# Patient Record
Sex: Male | Born: 1975 | Race: White | Hispanic: No | Marital: Married | Smoking: Former smoker
Health system: Southern US, Community
[De-identification: ages and names within clinical notes are randomized; demographics above are authoritative.]

## PROBLEM LIST (undated history)

## (undated) DIAGNOSIS — E291 Testicular hypofunction: Secondary | ICD-10-CM

## (undated) DIAGNOSIS — K219 Gastro-esophageal reflux disease without esophagitis: Secondary | ICD-10-CM

## (undated) DIAGNOSIS — R7689 Other specified abnormal immunological findings in serum: Secondary | ICD-10-CM

## (undated) DIAGNOSIS — S46009A Unspecified injury of muscle(s) and tendon(s) of the rotator cuff of unspecified shoulder, initial encounter: Secondary | ICD-10-CM

## (undated) DIAGNOSIS — K828 Other specified diseases of gallbladder: Secondary | ICD-10-CM

## (undated) DIAGNOSIS — R790 Abnormal level of blood mineral: Secondary | ICD-10-CM

## (undated) DIAGNOSIS — R569 Unspecified convulsions: Secondary | ICD-10-CM

## (undated) DIAGNOSIS — I10 Essential (primary) hypertension: Secondary | ICD-10-CM

## (undated) DIAGNOSIS — R768 Other specified abnormal immunological findings in serum: Secondary | ICD-10-CM

## (undated) HISTORY — DX: Other specified diseases of gallbladder: K82.8

## (undated) HISTORY — DX: Gastro-esophageal reflux disease without esophagitis: K21.9

## (undated) HISTORY — DX: Other specified abnormal immunological findings in serum: R76.89

## (undated) HISTORY — DX: Abnormal level of blood mineral: R79.0

## (undated) HISTORY — DX: Unspecified injury of muscle(s) and tendon(s) of the rotator cuff of unspecified shoulder, initial encounter: S46.009A

## (undated) HISTORY — DX: Testicular hypofunction: E29.1

## (undated) HISTORY — DX: Other specified abnormal immunological findings in serum: R76.8

## (undated) HISTORY — PX: UPPER GASTROINTESTINAL ENDOSCOPY: SHX188

## (undated) HISTORY — PX: CHOLECYSTECTOMY: SHX55

## (undated) HISTORY — DX: Essential (primary) hypertension: I10

---

## 2012-07-05 ENCOUNTER — Encounter: Payer: Self-pay | Admitting: Internal Medicine

## 2012-07-05 ENCOUNTER — Ambulatory Visit (INDEPENDENT_AMBULATORY_CARE_PROVIDER_SITE_OTHER): Payer: BC Managed Care – PPO | Admitting: Internal Medicine

## 2012-07-05 VITALS — BP 134/80 | HR 60 | Ht 74.0 in | Wt 246.0 lb

## 2012-07-05 DIAGNOSIS — K219 Gastro-esophageal reflux disease without esophagitis: Secondary | ICD-10-CM

## 2012-07-05 DIAGNOSIS — R1310 Dysphagia, unspecified: Secondary | ICD-10-CM | POA: Insufficient documentation

## 2012-07-05 MED ORDER — FLUCONAZOLE 100 MG PO TABS: 100.0000 mg | ORAL_TABLET | Freq: Every day | ORAL | Status: DC

## 2012-07-05 MED ORDER — OMEPRAZOLE-SODIUM BICARBONATE 40-1100 MG PO CAPS: 1.0000 | ORAL_CAPSULE | Freq: Every day | ORAL | Status: DC

## 2012-07-05 NOTE — Patient Instructions (Addendum)
You have been scheduled for an endoscopy with propofol. Please follow written instructions given to you at your visit today. If you use inhalers (even only as needed), please bring them with you on the day of your procedure.  Today we are giving you a handout on dysphagia to read and follow level 3 diet options.  We are giving you samples of Zegerid to take one daily at bedtime.    Thank you for choosing me and [facility].  Iva Boop, M.D., [facility]

## 2012-07-05 NOTE — Progress Notes (Signed)
Subjective:    Patient ID: Dakota Lopez, male    DOB: March 19, 1976, 37 y.o.   MRN: [id number]  HPI This nice man has had about a 6 week hx of solid food dysphagia. There is a suprasternal sticking point with some mild discomfort swallowing. Started after antibiotics and prednisone for otitis media. Is on Nexium for few weeks, ? Benefit. Has used PPI intermittent periods over the years for heartburn. Has gone a long time w/o PPI and no sxs at times. Losing weight lately as he is eating less - mainly sandwiches at this time. No thrush noted.   No Known Allergies Outpatient Prescriptions Prior to Visit  Medication Sig Dispense Refill  . esomeprazole (NEXIUM) 40 MG capsule Take 40 mg by mouth daily before breakfast.      . aspirin 81 MG tablet Take 81 mg by mouth daily.      . fluticasone (FLONASE) 50 MCG/ACT nasal spray Place 1 spray into the nose daily.      . Multiple Vitamin (MULTIVITAMIN) capsule Take 1 capsule by mouth daily.      . phentermine (ADIPEX-P) 37.5 MG tablet Take 37.5 mg by mouth daily before breakfast.      . predniSONE (DELTASONE) 5 MG tablet Take 5 mg by mouth. As directed      . vitamin C (ASCORBIC ACID) 500 MG tablet Take 500 mg by mouth daily.       No facility-administered medications prior to visit.   Past Medical History  Diagnosis Date  . Hypertension   . GERD (gastroesophageal reflux disease)   . Helicobacter pylori ab+     Prevpack Tx  . Biliary dyskinesia    Past Surgical History  Procedure Laterality Date  . Cholecystectomy    . Upper gastrointestinal endoscopy     History   Social History  . Marital Status: Married    Spouse Name: N/A    Number of Children: 2  .     Occupational History  . [employer] doctor     Social History Main Topics  . Smoking status: Never Smoker   . Smokeless tobacco: Current User  . Alcohol Use: No  . Drug Use: No    Social History Narrative   Married - 2 daughters   Long-haul truck driver   8 caffeine drinks daily     Family History  Problem Relation Age of Onset  . Heart attack Father   . Colon cancer Neg Hx   . Lung cancer Paternal Uncle     metastasized to stomach     Review of Systems Allergy/sinus problems All other ROS negative    Objective:   Physical Exam General:  Well-developed, well-nourished and in no acute distress Eyes:  anicteric. ENT:   Mouth and posterior pharynx free of lesions. No thrush Neck:   supple w/o thyromegaly or mass.  Lungs: Clear to auscultation bilaterally. Heart:  S1S2, no rubs, murmurs, gallops. Abdomen:  soft, non-tender, no hepatosplenomegaly, hernia, or mass and BS+.  Lymph:  no cervical or supraclavicular adenopathy. Extremities:   no edema Skin   no rash. Neuro:  A&O x 3.  Psych:  appropriate mood and  Affect.   Data Reviewed: GB US, HIDA 2009 EGD and normal duodenal bxs 2008    Assessment & Plan:  Dysphagia, unspecified  GERD (gastroesophageal reflux disease)  1. Empiric fluconazole for now as sounds like could be candida after antibiotics and prednisone 2. EGD/dilation The risks and benefits as well as alternatives of endoscopic  procedure(s) have been discussed and reviewed. All questions answered. The patient agrees to proceed. 3. Second PPI Zegerid 4. Dysphagia III diet 5. Reduce caffeine  ZO:XWRUE, Chrissie Noa

## 2012-07-10 ENCOUNTER — Encounter: Payer: Self-pay | Admitting: Internal Medicine

## 2012-07-10 ENCOUNTER — Ambulatory Visit (AMBULATORY_SURGERY_CENTER): Payer: BC Managed Care – PPO | Admitting: Internal Medicine

## 2012-07-10 VITALS — BP 131/87 | HR 55 | Temp 97.0°F | Resp 18 | Ht 74.0 in | Wt 246.0 lb

## 2012-07-10 DIAGNOSIS — R1310 Dysphagia, unspecified: Secondary | ICD-10-CM

## 2012-07-10 DIAGNOSIS — D130 Benign neoplasm of esophagus: Secondary | ICD-10-CM

## 2012-07-10 MED ORDER — SODIUM CHLORIDE 0.9 % IV SOLN: 500.0000 mL | INTRAVENOUS | Status: DC

## 2012-07-10 NOTE — Patient Instructions (Addendum)
There were changes in the esophagus that suggest a problem called eosinophilic esophagitis. I took biopsies to see if that is the case. I also stretched the esophagus to help your swallowing also.  You could stop the fluconazole medication at this point and I will call with the biopsy results and plans when they come in next week.  Thank you for choosing me and [facility].  Iva Boop, MD, FACG  YOU HAD AN ENDOSCOPIC PROCEDURE TODAY AT THE  ENDOSCOPY CENTER: Refer to the procedure report that was given to you for any specific questions about what was found during the examination.  If the procedure report does not answer your questions, please call your gastroenterologist to clarify.  If you requested that your care partner not be given the details of your procedure findings, then the procedure report has been included in a sealed envelope for you to review at your convenience later.  YOU SHOULD EXPECT: Some feelings of bloating in the abdomen. Passage of more gas than usual.  Walking can help get rid of the air that was put into your GI tract during the procedure and reduce the bloating. If you had a lower endoscopy (such as a colonoscopy or flexible sigmoidoscopy) you may notice spotting of blood in your stool or on the toilet paper. If you underwent a bowel prep for your procedure, then you may not have a normal bowel movement for a few days.  DIET: NOTHING TO EAT OR DRINK UNTIL 5:00 P.M. TODAY. 5:00 UNTIL 6:00 TODAY ONLY CLEAR LIQUIDS. AFTER 6 PM ONLY SOFT FOODS UNTIL THE MORNING. RESUME YOUR DIET IN AM.  ACTIVITY: Your care partner should take you home directly after the procedure.  You should plan to take it easy, moving slowly for the rest of the day.  You can resume normal activity the day after the procedure however you should NOT DRIVE or use heavy machinery for 24 hours (because of the sedation medicines used during the test).    SYMPTOMS TO REPORT  IMMEDIATELY: A gastroenterologist can be reached at any hour.  During normal business hours, 8:30 AM to 5:00 PM Monday through Friday, call [phone].  After hours and on weekends, please call the GI answering service at [phone] who will take a message and have the physician on call contact you.  Following upper endoscopy (EGD)  Vomiting of blood or coffee ground material  New chest pain or pain under the shoulder blades  Painful or persistently difficult swallowing  New shortness of breath  Fever of 100F or higher  Black, tarry-looking stools  FOLLOW UP: If any biopsies were taken you will be contacted by phone or by letter within the next 1-3 weeks.  Call your gastroenterologist if you have not heard about the biopsies in 3 weeks.  Our staff will call the home number listed on your records the next business day following your procedure to check on you and address any questions or concerns that you may have at that time regarding the information given to you following your procedure. This is a courtesy call and so if there is no answer at the home number and we have not heard from you through the emergency physician on call, we will assume that you have returned to your regular daily activities without incident.  SIGNATURES/CONFIDENTIALITY: You and/or your care partner have signed paperwork which will be entered into your electronic medical record.  These signatures attest to the fact that that the information  above on your After Visit Summary has been reviewed and is understood.  Full responsibility of the confidentiality of this discharge information lies with you and/or your care-partner. 

## 2012-07-10 NOTE — Progress Notes (Signed)
Called to room to assist during endoscopic procedure.  Patient ID and intended procedure confirmed with present staff. Received instructions for my participation in the procedure from the performing physician.  

## 2012-07-10 NOTE — Progress Notes (Signed)
Lidocaine-40mg IV prior to Propofol InductionPropofol given over incremental dosages 
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
## 2012-07-10 NOTE — Op Note (Addendum)
[facility] [address] [place], [address]   ENDOSCOPY PROCEDURE REPORT  PATIENT: Dakota, Lopez  MR#: [id number] BIRTHDATE: 04-Oct-1975 , 36  yrs. old GENDER: Male ENDOSCOPIST: Iva Boop, MD, [facility] PROCEDURE DATE:  07/10/2012 PROCEDURE:  EGD w/ biopsy and Maloney dilation of esophagus ASA CLASS:     Class II INDICATIONS:  Dysphagia. MEDICATIONS: propofol (Diprivan) 250mg  IV, MAC sedation, administered by CRNA, and These medications were titrated to patient response per physician's verbal order TOPICAL ANESTHETIC: none  DESCRIPTION OF PROCEDURE: After the risks benefits and alternatives of the procedure were thoroughly explained, informed consent was obtained.  The LB GIF-H180 [id number] endoscope was introduced through the mouth and advanced to the second portion of the duodenum. Without limitations.  The instrument was slowly withdrawn as the mucosa was fully examined.        ESOPHAGUS: Possible eosinophilic esophagitis with mucosal changes that included longitudinal furrows and felinization of the esophagus were found in the entire esophagus.   Multiple biopsies taken from distal, mid and proximal esophagus.  The remainder of the upper endoscopy exam was otherwise normal. Retroflexed views revealed no abnormalities.     The scope was then withdrawn from the patient, a 84 [place] Maloney dilator passed without difficulty or heme, and the procedure completed.  COMPLICATIONS: There were no complications. ENDOSCOPIC IMPRESSION: 1.  ? of  Eosinophilic esophagitis 2.   The remainder of the upper endoscopy exam was otherwise normal  RECOMMENDATIONS: 1.  Clear liquids until 5PM, then soft foods rest of day.  Resume prior diet tomorrow. 2.  Office will call with results 3.   Stop fluconazole    eSigned:  Iva Boop, MD, [facility] 07/10/2012 3:57 PMRevised: 07/10/2012 3:57 PMCC:The Patient  and Raenette Rover, MD

## 2012-07-11 ENCOUNTER — Telehealth: Payer: Self-pay | Admitting: *Deleted

## 2012-07-11 NOTE — Telephone Encounter (Signed)
Left message that called for f/u 

## 2012-07-15 NOTE — Progress Notes (Signed)
Quick Note:  Biopsies did not show any esophagitis  How is the swallowing after the dilation?  No recall or letter from [facility] ______

## 2012-08-23 ENCOUNTER — Telehealth: Payer: Self-pay | Admitting: Internal Medicine

## 2012-08-23 ENCOUNTER — Telehealth: Payer: Self-pay | Admitting: *Deleted

## 2012-08-23 MED ORDER — OMEPRAZOLE-SODIUM BICARBONATE 40-1100 MG PO CAPS: 1.0000 | ORAL_CAPSULE | Freq: Every day | ORAL | Status: DC

## 2012-08-23 MED ORDER — ESOMEPRAZOLE MAGNESIUM 40 MG PO CPDR: 40.0000 mg | DELAYED_RELEASE_CAPSULE | Freq: Every day | ORAL | Status: DC

## 2012-08-23 NOTE — Telephone Encounter (Signed)
Refills sent in as requested. 

## 2012-08-23 NOTE — Telephone Encounter (Signed)
Left msg on triage wanting to get refill on nexium & zegrid. Called wife back no answer LMOM call wrong floor. Left # to Dr. Leone Payor...Raechel Chute

## 2015-08-27 ENCOUNTER — Observation Stay (HOSPITAL_COMMUNITY)
Admission: EM | Admit: 2015-08-27 | Discharge: 2015-08-29 | Disposition: A | Discharge status: Home / Self Care | Payer: Managed Care, Other (non HMO) | Attending: Internal Medicine | Admitting: Internal Medicine

## 2015-08-27 ENCOUNTER — Observation Stay (HOSPITAL_COMMUNITY): Payer: Managed Care, Other (non HMO)

## 2015-08-27 ENCOUNTER — Encounter (HOSPITAL_COMMUNITY): Payer: Self-pay | Admitting: Adult Health

## 2015-08-27 ENCOUNTER — Emergency Department (HOSPITAL_COMMUNITY): Payer: Managed Care, Other (non HMO)

## 2015-08-27 DIAGNOSIS — R001 Bradycardia, unspecified: Secondary | ICD-10-CM | POA: Insufficient documentation

## 2015-08-27 DIAGNOSIS — R569 Unspecified convulsions: Principal | ICD-10-CM

## 2015-08-27 DIAGNOSIS — R51 Headache: Secondary | ICD-10-CM | POA: Insufficient documentation

## 2015-08-27 DIAGNOSIS — S01512A Laceration without foreign body of oral cavity, initial encounter: Secondary | ICD-10-CM | POA: Diagnosis not present

## 2015-08-27 DIAGNOSIS — Y9289 Other specified places as the place of occurrence of the external cause: Secondary | ICD-10-CM | POA: Diagnosis not present

## 2015-08-27 DIAGNOSIS — Y998 Other external cause status: Secondary | ICD-10-CM | POA: Diagnosis not present

## 2015-08-27 DIAGNOSIS — R9431 Abnormal electrocardiogram [ECG] [EKG]: Secondary | ICD-10-CM | POA: Insufficient documentation

## 2015-08-27 DIAGNOSIS — Y9389 Activity, other specified: Secondary | ICD-10-CM | POA: Diagnosis not present

## 2015-08-27 DIAGNOSIS — R55 Syncope and collapse: Secondary | ICD-10-CM | POA: Diagnosis not present

## 2015-08-27 HISTORY — DX: Unspecified convulsions: R56.9

## 2015-08-27 LAB — TYPE AND SCREEN
ABO/RH(D): A POS
Antibody Screen: NEGATIVE

## 2015-08-27 LAB — COMPREHENSIVE METABOLIC PANEL
ALK PHOS: 56 U/L (ref 38–126)
ALT: 53 U/L (ref 17–63)
ANION GAP: 12 (ref 5–15)
AST: 61 U/L — ABNORMAL HIGH (ref 15–41)
Albumin: 4.4 g/dL (ref 3.5–5.0)
BUN: 12 mg/dL (ref 6–20)
CO2: 23 mmol/L (ref 22–32)
Calcium: 10 mg/dL (ref 8.9–10.3)
Chloride: 106 mmol/L (ref 101–111)
Creatinine, Ser: 1.33 mg/dL — ABNORMAL HIGH (ref 0.61–1.24)
Glucose, Bld: 95 mg/dL (ref 65–99)
Potassium: 4.5 mmol/L (ref 3.5–5.1)
SODIUM: 141 mmol/L (ref 135–145)
Total Bilirubin: 1.1 mg/dL (ref 0.3–1.2)
Total Protein: 6.8 g/dL (ref 6.5–8.1)

## 2015-08-27 LAB — PROTIME-INR
INR: 1.18 (ref 0.00–1.49)
PROTHROMBIN TIME: 15.2 s (ref 11.6–15.2)

## 2015-08-27 LAB — CBC
HCT: 43.7 % (ref 39.0–52.0)
HEMOGLOBIN: 15.3 g/dL (ref 13.0–17.0)
MCH: 30.1 pg (ref 26.0–34.0)
MCHC: 35 g/dL (ref 30.0–36.0)
MCV: 86 fL (ref 78.0–100.0)
Platelets: 199 10*3/uL (ref 150–400)
RBC: 5.08 MIL/uL (ref 4.22–5.81)
RDW: 13.2 % (ref 11.5–15.5)
WBC: 9.4 10*3/uL (ref 4.0–10.5)

## 2015-08-27 LAB — ABO/RH: ABO/RH(D): A POS

## 2015-08-27 MED ORDER — LEVETIRACETAM 500 MG PO TABS
500.0000 mg | ORAL_TABLET | Freq: Two times a day (BID) | ORAL | Status: DC
  Administered 2015-08-28 – 2015-08-29 (×3): 500 mg via ORAL
  Filled 2015-08-27 (×3): qty 1

## 2015-08-27 MED ORDER — SODIUM CHLORIDE 0.9 % IV SOLN
1000.0000 mg | Freq: Once | INTRAVENOUS | Status: AC
  Administered 2015-08-27: 1000 mg via INTRAVENOUS
  Filled 2015-08-27: qty 10

## 2015-08-27 MED ORDER — ENOXAPARIN SODIUM 40 MG/0.4ML ~~LOC~~ SOLN
40.0000 mg | SUBCUTANEOUS | Status: DC
  Administered 2015-08-28 – 2015-08-29 (×2): 40 mg via SUBCUTANEOUS
  Filled 2015-08-27 (×2): qty 0.4

## 2015-08-27 MED ORDER — SODIUM CHLORIDE 0.9 % IV BOLUS (SEPSIS)
1000.0000 mL | Freq: Once | INTRAVENOUS | Status: AC
  Administered 2015-08-27: 1000 mL via INTRAVENOUS

## 2015-08-27 MED ORDER — ACETAMINOPHEN 500 MG PO TABS
1000.0000 mg | ORAL_TABLET | Freq: Once | ORAL | Status: AC
  Administered 2015-08-27: 1000 mg via ORAL
  Filled 2015-08-27: qty 2

## 2015-08-27 MED ORDER — ONDANSETRON HCL 4 MG/2ML IJ SOLN
4.0000 mg | Freq: Once | INTRAMUSCULAR | Status: AC
  Administered 2015-08-27: 4 mg via INTRAVENOUS
  Filled 2015-08-27: qty 2
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
## 2015-08-28 ENCOUNTER — Observation Stay (HOSPITAL_COMMUNITY)
Admit: 2015-08-28 | Discharge: 2015-08-28 | Disposition: A | Discharge status: Home / Self Care | Payer: Managed Care, Other (non HMO) | Attending: Internal Medicine | Admitting: Internal Medicine

## 2015-08-28 DIAGNOSIS — R4182 Altered mental status, unspecified: Secondary | ICD-10-CM | POA: Diagnosis not present

## 2015-08-28 DIAGNOSIS — R55 Syncope and collapse: Secondary | ICD-10-CM | POA: Diagnosis not present

## 2015-08-28 DIAGNOSIS — R569 Unspecified convulsions: Secondary | ICD-10-CM | POA: Diagnosis not present

## 2015-08-28 LAB — RAPID URINE DRUG SCREEN, HOSP PERFORMED
AMPHETAMINES: NOT DETECTED
BARBITURATES: NOT DETECTED
Benzodiazepines: POSITIVE — AB
Cocaine: NOT DETECTED
OPIATES: NOT DETECTED
TETRAHYDROCANNABINOL: NOT DETECTED

## 2015-08-28 LAB — TSH: TSH: 0.75 u[IU]/mL (ref 0.350–4.500)

## 2015-08-28 MED ORDER — NICOTINE 7 MG/24HR TD PT24
7.0000 mg | MEDICATED_PATCH | Freq: Every day | TRANSDERMAL | Status: DC
  Administered 2015-08-28 – 2015-08-29 (×2): 7 mg via TRANSDERMAL
  Filled 2015-08-28 (×2): qty 1

## 2015-08-28 MED ORDER — GADOBENATE DIMEGLUMINE 529 MG/ML IV SOLN
20.0000 mL | Freq: Once | INTRAVENOUS | Status: AC | PRN
  Administered 2015-08-28: 20 mL via INTRAVENOUS

## 2015-08-28 NOTE — Progress Notes (Signed)
Patient arrived to unit via stretcher with NT with 2L O2. Wife waiting at bedside. Pt drowsy. VS taken. Oriented pt and wife to unit and procedure expected in AM.

## 2015-08-28 NOTE — Progress Notes (Signed)
EEG Completed; Results Pending  
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
## 2015-08-29 ENCOUNTER — Observation Stay (HOSPITAL_BASED_OUTPATIENT_CLINIC_OR_DEPARTMENT_OTHER): Payer: Managed Care, Other (non HMO)

## 2015-08-29 DIAGNOSIS — R569 Unspecified convulsions: Secondary | ICD-10-CM | POA: Diagnosis not present

## 2015-08-29 DIAGNOSIS — R55 Syncope and collapse: Secondary | ICD-10-CM

## 2015-08-29 DIAGNOSIS — R9431 Abnormal electrocardiogram [ECG] [EKG]: Secondary | ICD-10-CM

## 2015-08-29 LAB — ECHOCARDIOGRAM COMPLETE
Height: 74 in
WEIGHTICAEL: 3804.8 [oz_av]

## 2015-08-29 LAB — CBC
HCT: 41 % (ref 39.0–52.0)
Hemoglobin: 13.8 g/dL (ref 13.0–17.0)
MCH: 29.6 pg (ref 26.0–34.0)
MCHC: 33.7 g/dL (ref 30.0–36.0)
MCV: 87.8 fL (ref 78.0–100.0)
PLATELETS: 204 10*3/uL (ref 150–400)
RBC: 4.67 MIL/uL (ref 4.22–5.81)
RDW: 13.7 % (ref 11.5–15.5)
WBC: 7.3 10*3/uL (ref 4.0–10.5)

## 2015-08-29 LAB — RPR: RPR: NONREACTIVE

## 2015-08-29 LAB — BASIC METABOLIC PANEL
Anion gap: 8 (ref 5–15)
BUN: 14 mg/dL (ref 6–20)
CALCIUM: 10.1 mg/dL (ref 8.9–10.3)
CO2: 28 mmol/L (ref 22–32)
CREATININE: 1.25 mg/dL — AB (ref 0.61–1.24)
Chloride: 104 mmol/L (ref 101–111)
Glucose, Bld: 94 mg/dL (ref 65–99)
Potassium: 4.4 mmol/L (ref 3.5–5.1)
SODIUM: 140 mmol/L (ref 135–145)

## 2015-08-29 LAB — HIV ANTIBODY (ROUTINE TESTING W REFLEX): HIV SCREEN 4TH GENERATION: NONREACTIVE

## 2015-08-29 MED ORDER — LEVETIRACETAM 500 MG PO TABS: 500.0000 mg | ORAL_TABLET | Freq: Two times a day (BID) | ORAL | Status: DC

## 2015-08-29 NOTE — Progress Notes (Signed)
Patient DC instructions read and patient with no additional questions, IV and tele DCd., patient belongings all in his room, awaiting wheelchair transport
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
## 2015-08-29 NOTE — Progress Notes (Signed)
  Echocardiogram 2D Echocardiogram has been performed.  Axiel Fjeld 08/29/2015, 11:16 AM

## 2015-08-29 NOTE — Significant Event (Signed)
Pt has 2.64 sec. Pause on the heart monitor, pt is asymptomatic, EKG done and was sinus bradycardia. Result relayed to K. Harbine, mid level on call. No new orders given. Will continue to monitor.

## 2015-08-29 NOTE — Progress Notes (Signed)
Entered room for my neurocheck and hourly round. Patient denying any seizures or any neuro symptoms. Noted 02 and suction set up on wall. Seizure pads noted on bedrails. Bed alarm set. Patient eager to see doctor. Will cont to monitor

## 2015-08-31 NOTE — Procedures (Signed)
History: 40 yo M with seizure  Sedation: none  Technique: This is a 21 channel routine scalp EEG performed at the bedside with bipolar and monopolar montages arranged in accordance to the international 10/20 system of electrode placement. One channel was dedicated to EKG recording.    Background: The background consists of intermixed alpha and beta activities. There is a well defined posterior dominant rhythm of 10 Hz that attenuates with eye opening. Sleep is recorded with normal appearing structures.   Photic stimulation: Physiologic driving is absent  EEG Abnormalities: none  Clinical Interpretation: This normal EEG is recorded in the waking and sleep state. There was no seizure or seizure predisposition recorded on this study. Please note that a normal EEG does not preclude the possibility of epilepsy.   Roland Rack, MD [facility] [phone]  If 7pm- 7am, please page neurology on call as listed in [facility].

## 2015-09-02 ENCOUNTER — Ambulatory Visit (INDEPENDENT_AMBULATORY_CARE_PROVIDER_SITE_OTHER): Payer: Managed Care, Other (non HMO) | Admitting: Neurology

## 2015-09-02 ENCOUNTER — Encounter: Payer: Self-pay | Admitting: Neurology

## 2015-09-02 ENCOUNTER — Other Ambulatory Visit: Payer: Self-pay

## 2015-09-02 ENCOUNTER — Telehealth: Payer: Self-pay

## 2015-09-02 VITALS — BP 118/66 | HR 63 | Ht 74.0 in | Wt 233.0 lb

## 2015-09-02 DIAGNOSIS — R569 Unspecified convulsions: Secondary | ICD-10-CM | POA: Diagnosis not present

## 2015-09-02 NOTE — Patient Instructions (Signed)
Since it took so long for you to wake up, I have to assume you had a seizure.  Since this is a first-time unprovoked seizure, and MRI and EEG were normal, we don't need to start medication.  However, as per [facility], you must be event-free for 6 months before you can drive again.  You must be evaluated by the Medical Advisory Board at the [facility] to determine restrictions and resume driving for your commercial license.  I would recommend not taking a bath or swim alone.  I would recommend to avoid heights and ladders for now.  Follow up in 6 months.

## 2015-09-02 NOTE — Progress Notes (Signed)
Chart forwarded.  
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
## 2015-09-03 ENCOUNTER — Encounter: Payer: Self-pay | Admitting: Neurology

## 2015-09-10 ENCOUNTER — Ambulatory Visit: Payer: Self-pay | Admitting: Neurology

## 2015-09-20 NOTE — Telephone Encounter (Signed)
Error

## 2015-09-23 ENCOUNTER — Encounter: Payer: Self-pay | Admitting: Neurology

## 2015-09-23 ENCOUNTER — Ambulatory Visit (INDEPENDENT_AMBULATORY_CARE_PROVIDER_SITE_OTHER): Payer: Managed Care, Other (non HMO) | Admitting: Neurology

## 2015-09-23 VITALS — BP 141/89 | HR 71 | Resp 18 | Ht 74.0 in | Wt 243.0 lb

## 2015-09-23 DIAGNOSIS — Z9119 Patient's noncompliance with other medical treatment and regimen: Secondary | ICD-10-CM | POA: Diagnosis not present

## 2015-09-23 DIAGNOSIS — R0683 Snoring: Secondary | ICD-10-CM

## 2015-09-23 DIAGNOSIS — Z5320 Procedure and treatment not carried out because of patient's decision for unspecified reasons: Secondary | ICD-10-CM

## 2015-09-23 DIAGNOSIS — R569 Unspecified convulsions: Secondary | ICD-10-CM | POA: Diagnosis not present
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
## 2015-12-03 DIAGNOSIS — G40909 Epilepsy, unspecified, not intractable, without status epilepticus: Secondary | ICD-10-CM | POA: Insufficient documentation

## 2016-01-24 DIAGNOSIS — K219 Gastro-esophageal reflux disease without esophagitis: Secondary | ICD-10-CM | POA: Insufficient documentation

## 2016-01-24 DIAGNOSIS — G4733 Obstructive sleep apnea (adult) (pediatric): Secondary | ICD-10-CM | POA: Insufficient documentation

## 2016-01-24 HISTORY — DX: Obstructive sleep apnea (adult) (pediatric): G47.33

## 2016-01-26 DIAGNOSIS — R4020 Unspecified coma: Secondary | ICD-10-CM

## 2016-01-26 HISTORY — DX: Unspecified coma: R40.20

## 2016-02-28 DIAGNOSIS — G40109 Localization-related (focal) (partial) symptomatic epilepsy and epileptic syndromes with simple partial seizures, not intractable, without status epilepticus: Secondary | ICD-10-CM | POA: Insufficient documentation

## 2016-02-28 HISTORY — DX: Localization-related (focal) (partial) symptomatic epilepsy and epileptic syndromes with simple partial seizures, not intractable, without status epilepticus: G40.109

## 2016-03-06 ENCOUNTER — Ambulatory Visit: Payer: Managed Care, Other (non HMO) | Admitting: Neurology

## 2019-04-26 ENCOUNTER — Other Ambulatory Visit: Payer: Self-pay

## 2019-04-26 ENCOUNTER — Emergency Department (HOSPITAL_COMMUNITY)
Admission: EM | Admit: 2019-04-26 | Discharge: 2019-04-26 | Disposition: A | Discharge status: Home / Self Care | Payer: BC Managed Care – PPO | Attending: Emergency Medicine | Admitting: Emergency Medicine

## 2019-04-26 ENCOUNTER — Encounter (HOSPITAL_COMMUNITY): Payer: Self-pay | Admitting: Emergency Medicine

## 2019-04-26 ENCOUNTER — Emergency Department (HOSPITAL_COMMUNITY): Payer: BC Managed Care – PPO

## 2019-04-26 DIAGNOSIS — F17290 Nicotine dependence, other tobacco product, uncomplicated: Secondary | ICD-10-CM | POA: Diagnosis not present

## 2019-04-26 DIAGNOSIS — R0789 Other chest pain: Secondary | ICD-10-CM | POA: Insufficient documentation

## 2019-04-26 DIAGNOSIS — I10 Essential (primary) hypertension: Secondary | ICD-10-CM | POA: Insufficient documentation

## 2019-04-26 DIAGNOSIS — R569 Unspecified convulsions: Secondary | ICD-10-CM

## 2019-04-26 DIAGNOSIS — Z79899 Other long term (current) drug therapy: Secondary | ICD-10-CM | POA: Insufficient documentation

## 2019-04-26 DIAGNOSIS — M791 Myalgia, unspecified site: Secondary | ICD-10-CM | POA: Insufficient documentation

## 2019-04-26 DIAGNOSIS — Z733 Stress, not elsewhere classified: Secondary | ICD-10-CM | POA: Diagnosis not present

## 2019-04-26 LAB — CBC WITH DIFFERENTIAL/PLATELET
Abs Immature Granulocytes: 0.12 10*3/uL — ABNORMAL HIGH (ref 0.00–0.07)
Basophils Absolute: 0 10*3/uL (ref 0.0–0.1)
Basophils Relative: 0 %
Eosinophils Absolute: 0 10*3/uL (ref 0.0–0.5)
Eosinophils Relative: 0 %
HCT: 42.2 % (ref 39.0–52.0)
Hemoglobin: 14.7 g/dL (ref 13.0–17.0)
Immature Granulocytes: 1 %
Lymphocytes Relative: 8 %
Lymphs Abs: 0.8 10*3/uL (ref 0.7–4.0)
MCH: 30.8 pg (ref 26.0–34.0)
MCHC: 34.8 g/dL (ref 30.0–36.0)
MCV: 88.5 fL (ref 80.0–100.0)
Monocytes Absolute: 0.8 10*3/uL (ref 0.1–1.0)
Monocytes Relative: 7 %
Neutro Abs: 9 10*3/uL — ABNORMAL HIGH (ref 1.7–7.7)
Neutrophils Relative %: 84 %
Platelets: 180 10*3/uL (ref 150–400)
RBC: 4.77 MIL/uL (ref 4.22–5.81)
RDW: 13.2 % (ref 11.5–15.5)
WBC: 10.7 10*3/uL — ABNORMAL HIGH (ref 4.0–10.5)
nRBC: 0 % (ref 0.0–0.2)

## 2019-04-26 LAB — URINALYSIS, ROUTINE W REFLEX MICROSCOPIC
Bacteria, UA: NONE SEEN
Bilirubin Urine: NEGATIVE
Glucose, UA: NEGATIVE mg/dL
Hgb urine dipstick: NEGATIVE
Ketones, ur: NEGATIVE mg/dL
Leukocytes,Ua: NEGATIVE
Nitrite: NEGATIVE
Protein, ur: 100 mg/dL — AB
Specific Gravity, Urine: 1.015 (ref 1.005–1.030)
pH: 6 (ref 5.0–8.0)

## 2019-04-26 LAB — RAPID URINE DRUG SCREEN, HOSP PERFORMED
Amphetamines: NOT DETECTED
Barbiturates: NOT DETECTED
Benzodiazepines: NOT DETECTED
Cocaine: NOT DETECTED
Opiates: NOT DETECTED
Tetrahydrocannabinol: NOT DETECTED

## 2019-04-26 LAB — COMPREHENSIVE METABOLIC PANEL
ALT: 55 U/L — ABNORMAL HIGH (ref 0–44)
AST: 49 U/L — ABNORMAL HIGH (ref 15–41)
Albumin: 4 g/dL (ref 3.5–5.0)
Alkaline Phosphatase: 53 U/L (ref 38–126)
Anion gap: 11 (ref 5–15)
BUN: 16 mg/dL (ref 6–20)
CO2: 24 mmol/L (ref 22–32)
Calcium: 9.8 mg/dL (ref 8.9–10.3)
Chloride: 102 mmol/L (ref 98–111)
Creatinine, Ser: 1.34 mg/dL — ABNORMAL HIGH (ref 0.61–1.24)
GFR calc Af Amer: >60 mL/min (ref >60)
GFR calc non Af Amer: >60 mL/min (ref >60)
Glucose, Bld: 84 mg/dL (ref 70–99)
Potassium: 3.6 mmol/L (ref 3.5–5.1)
Sodium: 137 mmol/L (ref 135–145)
Total Bilirubin: 0.6 mg/dL (ref 0.3–1.2)
Total Protein: 6.4 g/dL — ABNORMAL LOW (ref 6.5–8.1)

## 2019-04-26 LAB — MAGNESIUM: Magnesium: 2.4 mg/dL (ref 1.7–2.4)

## 2019-04-26 LAB — ETHANOL: Alcohol, Ethyl (B): <10 mg/dL (ref <10)

## 2019-04-26 LAB — TROPONIN I (HIGH SENSITIVITY): Troponin I (High Sensitivity): 108 ng/L (ref <18)

## 2019-04-26 MED ORDER — LEVETIRACETAM 250 MG PO TABS: 250.0000 mg | ORAL_TABLET | Freq: Two times a day (BID) | ORAL | 1 refills | Status: DC

## 2019-04-26 MED ORDER — LEVETIRACETAM IN NACL 1000 MG/100ML IV SOLN
1000.0000 mg | Freq: Once | INTRAVENOUS | Status: AC
  Administered 2019-04-26: 03:00:00 1000 mg via INTRAVENOUS
  Filled 2019-04-26: qty 100

## 2019-04-26 MED ORDER — FENTANYL CITRATE (PF) 100 MCG/2ML IJ SOLN
50.0000 ug | Freq: Once | INTRAMUSCULAR | Status: AC
  Administered 2019-04-26: 03:00:00 50 ug via INTRAVENOUS
  Filled 2019-04-26: qty 2

## 2019-04-26 MED ORDER — ASPIRIN 81 MG PO CHEW
324.0000 mg | CHEWABLE_TABLET | Freq: Once | ORAL | Status: AC
  Administered 2019-04-26: 04:00:00 324 mg via ORAL
  Filled 2019-04-26: qty 4

## 2019-04-26 NOTE — ED Provider Notes (Addendum)
TIME SEEN: 2:51 AM  CHIEF COMPLAINT: Seizure  HPI: Patient is a 44 year old male with history of epilepsy on Keppra 1000 mg twice daily, hypertension who presents to the emergency department with a seizure.  States it has been years since his last seizure.  He is followed by Dr. Everette Rank in [facility].  Reports he has been compliant with his medication but has had significant amount of increased stress recently.  No fevers, cough, vomiting or diarrhea.  His wife, Geraldo Pitter, reports that he went to bed around 9 PM.  Woke up from sleep and started stating that he was not feeling well.  She states he asked for the trash can because he thought he was going to vomit.  When she came back into the bedroom his arms were tense and he began foaming at the mouth.  States this was not like his grand mal seizures that he has had previously.  She called 911.  Her sons came into the bedroom and helped get their father on the floor and rolled him onto his side.  She states that his face appeared gray and his lips were blue.  The 911 operator recommended that they start CPR.  She states they never checked to see if patient was pulseless.  She states that during CPR he was having agonal respirations.  Postictal with EMS.  No incontinence or tongue biting.  He is complaining of feeling very sore all over.  He denies any known preceding chest pain or shortness of breath.  ROS: See HPI Constitutional: no fever  Eyes: no drainage  ENT: no runny nose   Cardiovascular: Chest soreness Resp: no SOB  GI: no vomiting GU: no dysuria Integumentary: no rash  Allergy: no hives  Musculoskeletal: no leg swelling  Neurological: no slurred speech ROS otherwise negative  PAST MEDICAL HISTORY/PAST SURGICAL HISTORY:  Past Medical History:  Diagnosis Date  . Biliary dyskinesia   . GERD (gastroesophageal reflux disease)   . Helicobacter pylori ab+    Prevpack Tx  . Hypertension   . Rotator cuff injury   . Seizure [facility])    last one  was age 44    MEDICATIONS:  Prior to Admission medications   Medication Sig Start Date End Date Taking? Authorizing Provider  testosterone cypionate (DEPOTESTOSTERONE CYPIONATE) 200 MG/ML injection  07/30/15   [provider]  vitamin B-12 (CYANOCOBALAMIN) 1000 MCG tablet Take 1,000 mcg by mouth daily.    [provider]    ALLERGIES:  No Known Allergies  SOCIAL HISTORY:  Social History   Tobacco Use  . Smoking status: Never Smoker  . Smokeless tobacco: Current User  Substance Use Topics  . Alcohol use: No    Alcohol/week: 0.0 standard drinks    FAMILY HISTORY: Family History  Problem Relation Age of Onset  . Heart attack Father   . Lung cancer Paternal Uncle        metastasized to stomach   . Diabetes Maternal Aunt   . Lung cancer Paternal Aunt   . Colon cancer Neg Hx     EXAM: BP (!) 147/74   Pulse 63   Temp 97.8 F (36.6 C) (Oral)   Resp (!) 33   SpO2 98%  CONSTITUTIONAL: Alert and oriented x 3 and responds appropriately to questions. Well-appearing; well-nourished HEAD: Normocephalic, atraumatic EYES: Conjunctivae clear, pupils appear equal, EOM appear intact ENT: normal nose; moist mucous membranes NECK: Supple, normal ROM no midline spinal tenderness or step-off or deformity CARD: RRR; S1  and S2 appreciated; no murmurs, no clicks, no rubs, no gallops RESP: Normal chest excursion without splinting or tachypnea; breath sounds clear and equal bilaterally; no wheezes, no rhonchi, no rales, no hypoxia or respiratory distress, speaking full sentences ABD/GI: Normal bowel sounds; non-distended; soft, non-tender, no rebound, no guarding, no peritoneal signs, no hepatosplenomegaly BACK:  The back appears normal, no midline spinal tenderness or step-off or deformity EXT: Normal ROM in all joints; no deformity noted, no edema; no cyanosis SKIN: Normal color for age and race; warm; no rash on exposed skin NEURO: Moves all extremities equally, normal  sensation diffusely, cranial nerves II through XII intact, normal speech PSYCH: The patient's mood and manner are appropriate.   MEDICAL DECISION MAKING: Patient here after what sounds like a seizure rather than a cardiac event.  CPR was initiated at home given patient appeared ashen with blue lips.  Wife reports that they never checked to see if he was pulseless.  He was breathing on his own during CPR.  They continued CPR until EMS arrival per 911 operator instructions.    Complains of being sore all over but no signs of trauma on exam.  Will obtain labs, urine and chest x-ray.  EKG shows no ischemia.  Will give IV Keppra here.    ED PROGRESS: 4:12 AM  Labs unremarkable other than mildly elevated troponin.  Discussed with Dr. Sonia Side on-call for cardiology.  She agrees that this is likely secondary to chest compressions and does not recommend serial troponins.  His EKG shows no ischemic abnormality which cardiology has also reviewed.  She does not feel that this is an NSTEMI and agrees this sounds like a seizure.  Patient would prefer not to be admitted to the hospital.  I feel this is reasonable as does the cardiologist.  We will continue to monitor for further seizure activity.  5:47 AM  Patient reports feeling much better.  No longer having any pain.  No further seizure activity in the ED.  Still neurologically intact.  Has been at his baseline since arrival.  Urine shows no sign of infection.  Discussed with Dr. Rory Percy with neurology.  Will increase Keppra to 1250 mg twice daily per neurology recommendations.  Wife reports he has quite a bit of Keppra 1000 mg at home.  Will provide a new prescription for 250 mg tablets.  Wife is a Education administrator and feels comfortable having him taken 1000 mg tablet and a 250 mg tablet twice daily.  Wife will pick patient up from the ED.   At this time, I do not feel there is any life-threatening condition present. I have reviewed, interpreted and discussed all  results (EKG, imaging, lab, urine as appropriate) and exam findings with patient/family. I have reviewed nursing notes and appropriate previous records.  I feel the patient is safe to be discharged home without further emergent workup and can continue workup as an outpatient as needed. Discussed usual and customary return precautions. Patient/family verbalize understanding and are comfortable with this plan.  Outpatient follow-up has been provided as needed. All questions have been answered.   EKG Interpretation  Date/Time:  Saturday April 26 2019 02:40:15 EST Ventricular Rate:  61 PR Interval:    QRS Duration: 103 QT Interval:  349 QTC Calculation: 352 R Axis:   50 Text Interpretation: Sinus rhythm Prolonged PR interval Borderline repolarization abnormality No old tracing to compare Confirmed by Olivia Royse, Cyril Mourning [phone]) on 04/26/2019 2:50:53 AM       CRITICAL CARE  Performed by: Pryor Curia   Total critical care time: 45 minutes  Critical care time was exclusive of separately billable procedures and treating other patients.  Critical care was necessary to treat or prevent imminent or life-threatening deterioration.  Critical care was time spent personally by me on the following activities: development of treatment plan with patient and/or surrogate as well as nursing, discussions with consultants, evaluation of patient's response to treatment, examination of patient, obtaining history from patient or surrogate, ordering and performing treatments and interventions, ordering and review of laboratory studies, ordering and review of radiographic studies, pulse oximetry and re-evaluation of patient's condition.   Shlomie Antunez was evaluated in Emergency Department on 04/26/2019 for the symptoms described in the history of present illness. He was evaluated in the context of the global COVID-19 pandemic, which necessitated consideration that the patient might be at risk for infection with the  SARS-CoV-2 virus that causes COVID-19. Institutional protocols and algorithms that pertain to the evaluation of patients at risk for COVID-19 are in a state of rapid change based on information released by regulatory bodies including the CDC and federal and state organizations. These policies and algorithms were followed during the patient's care in the ED.  Patient was seen wearing N95, face shield, gloves.        Terris Germano, Delice Bison, DO 04/26/19 [phone]

## 2019-04-26 NOTE — ED Notes (Signed)
ED Provider at bedside. 

## 2019-04-26 NOTE — Discharge Instructions (Signed)
I recommend that you increase your Keppra to 1250 mg twice daily (take a 1000 mg tablet and a 250 mg tablet twice a day).  This was recommended by our neurologist.  Please follow-up closely with your neurologist as an outpatient.  You cannot drive until you have been cleared by your neurologist.

## 2019-04-26 NOTE — ED Triage Notes (Signed)
Pt from home BIB [facility] after seizure witnessed by wife. Uncertain how long seizure lasted.   EMS also reports pt became gray and due to decreased responsiveness, pt son began chest compressions lasting for about 5 minutes.   On arrival pt c/o generalized pain. A&Ox4.

## 2019-04-26 NOTE — ED Notes (Signed)
Discharge instructions including medication keppra dose, follow up care, and no driving recommendation discussed with pt. Pt verbalized understanding with no questions at this time. Pt to go home with wife

## 2021-05-06 DIAGNOSIS — E291 Testicular hypofunction: Secondary | ICD-10-CM | POA: Diagnosis not present

## 2021-05-06 DIAGNOSIS — Z79899 Other long term (current) drug therapy: Secondary | ICD-10-CM | POA: Diagnosis not present

## 2021-05-06 DIAGNOSIS — I10 Essential (primary) hypertension: Secondary | ICD-10-CM | POA: Diagnosis not present

## 2021-05-06 DIAGNOSIS — D518 Other vitamin B12 deficiency anemias: Secondary | ICD-10-CM | POA: Diagnosis not present

## 2021-08-22 IMAGING — DX DG CHEST 1V PORT
1 series · 1 of 1 positions shown · non-contrast
Comparison: 08/27/2015

CLINICAL DATA: Chest pain following CPR

EXAM:
PORTABLE CHEST 1 VIEW

[chest ap]
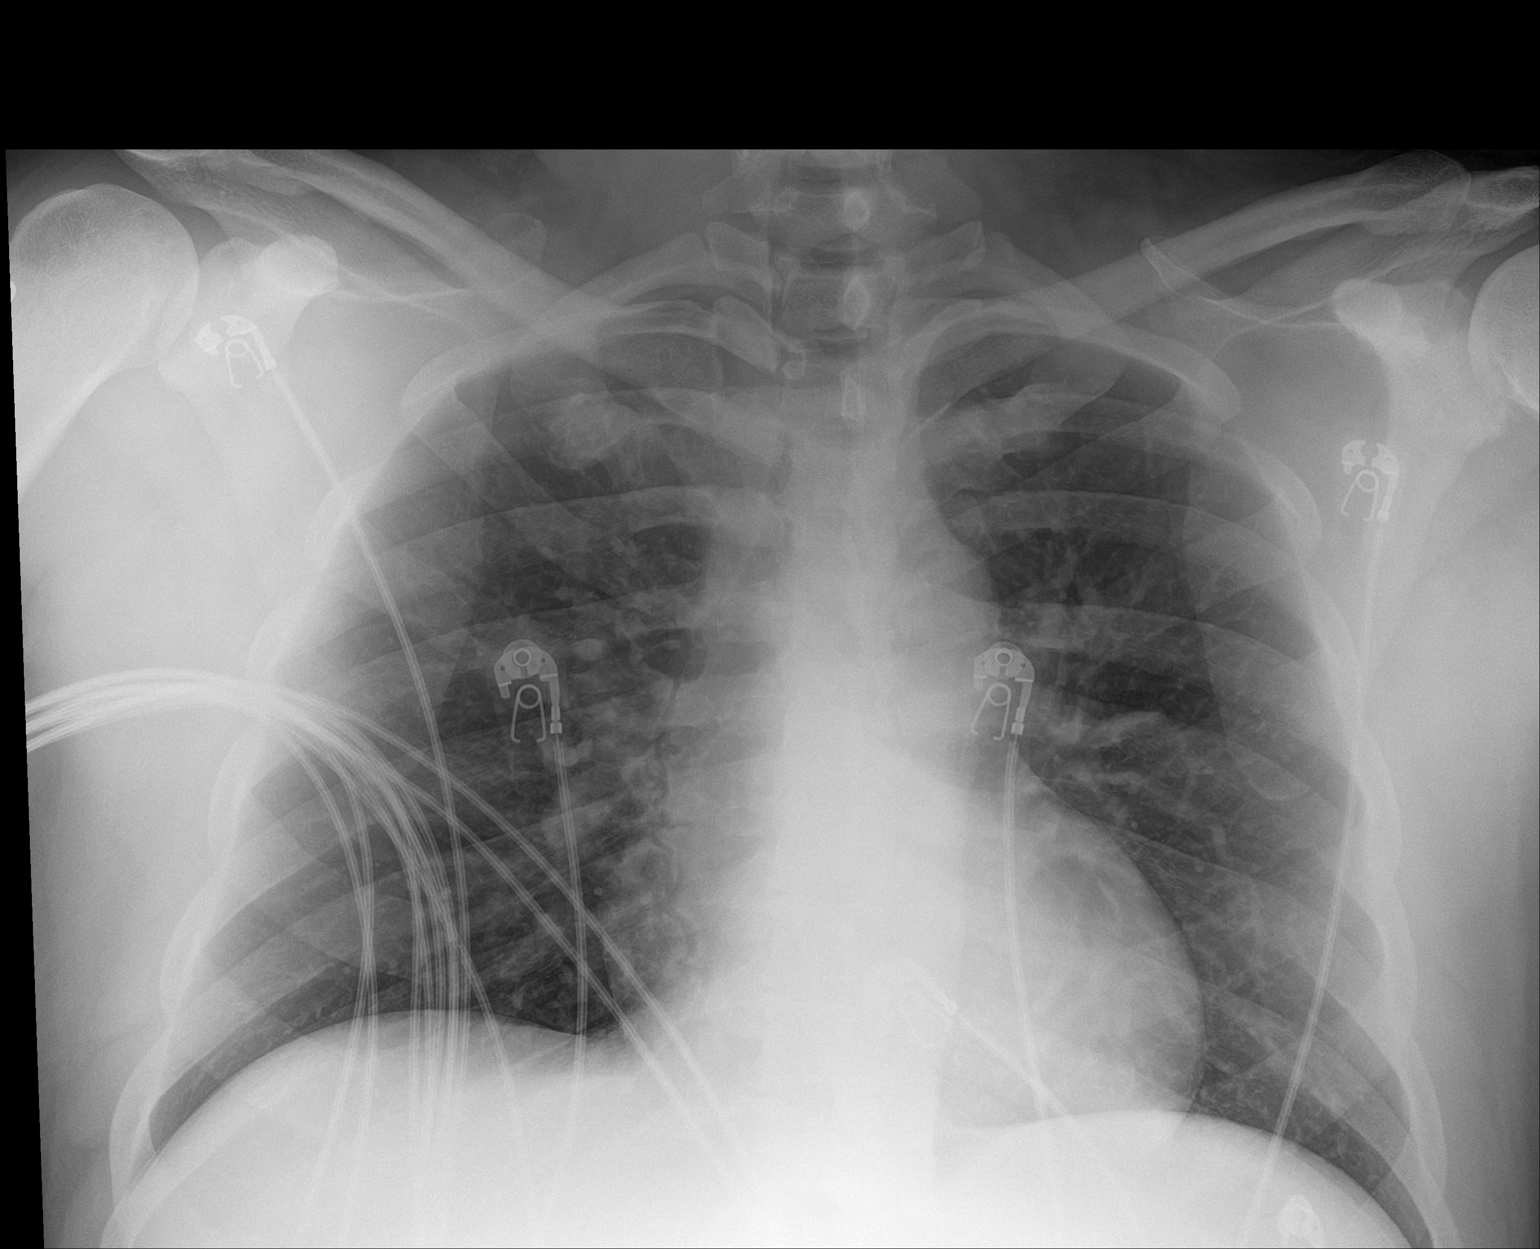

[1 of 1 positions shown; findings below may reference images not displayed]

FINDINGS: Cardiac shadow is stable. Lungs are well aerated bilaterally without
focal infiltrate or sizable effusion. No acute bony abnormality is
seen.
IMPRESSION: No acute abnormality noted.

## 2021-10-17 DIAGNOSIS — R569 Unspecified convulsions: Secondary | ICD-10-CM | POA: Diagnosis not present

## 2021-10-17 DIAGNOSIS — R232 Flushing: Secondary | ICD-10-CM | POA: Diagnosis not present

## 2021-10-22 DIAGNOSIS — R232 Flushing: Secondary | ICD-10-CM | POA: Diagnosis not present

## 2021-10-22 DIAGNOSIS — R569 Unspecified convulsions: Secondary | ICD-10-CM | POA: Diagnosis not present

## 2021-12-05 DIAGNOSIS — R569 Unspecified convulsions: Secondary | ICD-10-CM | POA: Diagnosis not present

## 2021-12-05 DIAGNOSIS — Z79899 Other long term (current) drug therapy: Secondary | ICD-10-CM | POA: Diagnosis not present

## 2021-12-29 DIAGNOSIS — H35372 Puckering of macula, left eye: Secondary | ICD-10-CM | POA: Diagnosis not present

## 2022-03-15 DIAGNOSIS — R569 Unspecified convulsions: Secondary | ICD-10-CM | POA: Diagnosis not present

## 2022-07-04 DIAGNOSIS — R569 Unspecified convulsions: Secondary | ICD-10-CM | POA: Diagnosis not present

## 2022-07-04 DIAGNOSIS — F17210 Nicotine dependence, cigarettes, uncomplicated: Secondary | ICD-10-CM | POA: Diagnosis not present

## 2022-07-04 DIAGNOSIS — G40909 Epilepsy, unspecified, not intractable, without status epilepticus: Secondary | ICD-10-CM | POA: Diagnosis not present

## 2022-07-04 DIAGNOSIS — R000 Tachycardia, unspecified: Secondary | ICD-10-CM | POA: Diagnosis not present

## 2022-07-04 DIAGNOSIS — R404 Transient alteration of awareness: Secondary | ICD-10-CM | POA: Diagnosis not present

## 2022-07-17 DIAGNOSIS — Z1331 Encounter for screening for depression: Secondary | ICD-10-CM | POA: Diagnosis not present

## 2022-07-17 DIAGNOSIS — M25519 Pain in unspecified shoulder: Secondary | ICD-10-CM | POA: Diagnosis not present

## 2022-07-17 DIAGNOSIS — E291 Testicular hypofunction: Secondary | ICD-10-CM | POA: Diagnosis not present

## 2022-07-17 DIAGNOSIS — G40909 Epilepsy, unspecified, not intractable, without status epilepticus: Secondary | ICD-10-CM | POA: Diagnosis not present

## 2022-07-21 DIAGNOSIS — E291 Testicular hypofunction: Secondary | ICD-10-CM | POA: Diagnosis not present

## 2022-07-21 DIAGNOSIS — R790 Abnormal level of blood mineral: Secondary | ICD-10-CM | POA: Diagnosis not present

## 2022-07-21 DIAGNOSIS — G40909 Epilepsy, unspecified, not intractable, without status epilepticus: Secondary | ICD-10-CM | POA: Diagnosis not present

## 2022-08-07 DIAGNOSIS — E785 Hyperlipidemia, unspecified: Secondary | ICD-10-CM | POA: Diagnosis not present

## 2022-09-14 DIAGNOSIS — R5383 Other fatigue: Secondary | ICD-10-CM | POA: Diagnosis not present

## 2022-09-14 DIAGNOSIS — I358 Other nonrheumatic aortic valve disorders: Secondary | ICD-10-CM | POA: Diagnosis not present

## 2022-09-14 DIAGNOSIS — Z8489 Family history of other specified conditions: Secondary | ICD-10-CM | POA: Diagnosis not present

## 2022-09-28 ENCOUNTER — Encounter: Payer: Self-pay | Admitting: Cardiology

## 2022-09-28 ENCOUNTER — Encounter: Payer: Self-pay | Admitting: *Deleted

## 2022-10-13 DIAGNOSIS — L309 Dermatitis, unspecified: Secondary | ICD-10-CM | POA: Diagnosis not present

## 2022-10-27 ENCOUNTER — Ambulatory Visit: Payer: BC Managed Care – PPO | Attending: Cardiology

## 2022-10-27 ENCOUNTER — Encounter: Payer: Self-pay | Admitting: Cardiology

## 2022-10-27 ENCOUNTER — Ambulatory Visit: Payer: BC Managed Care – PPO | Attending: Cardiology | Admitting: Cardiology

## 2022-10-27 VITALS — BP 136/70 | HR 61 | Ht 74.0 in | Wt 251.4 lb

## 2022-10-27 DIAGNOSIS — G4733 Obstructive sleep apnea (adult) (pediatric): Secondary | ICD-10-CM

## 2022-10-27 DIAGNOSIS — R569 Unspecified convulsions: Secondary | ICD-10-CM

## 2022-10-27 DIAGNOSIS — R4020 Unspecified coma: Secondary | ICD-10-CM

## 2022-10-27 DIAGNOSIS — Z8249 Family history of ischemic heart disease and other diseases of the circulatory system: Secondary | ICD-10-CM | POA: Diagnosis not present

## 2022-10-27 DIAGNOSIS — R0609 Other forms of dyspnea: Secondary | ICD-10-CM

## 2022-10-27 DIAGNOSIS — E785 Hyperlipidemia, unspecified: Secondary | ICD-10-CM

## 2022-10-27 NOTE — Patient Instructions (Signed)
Medication Instructions:  Your physician recommends that you continue on your current medications as directed. Please refer to the Current Medication list given to you today.  *If you need a refill on your cardiac medications before your next appointment, please call your pharmacy*   Lab Work: None Ordered If you have labs (blood work) drawn today and your tests are completely normal, you will receive your results only by: MyChart Message (if you have MyChart) OR A paper copy in the mail If you have any lab test that is abnormal or we need to change your treatment, we will call you to review the results.   Testing/Procedures: Your physician has requested that you have an echocardiogram. Echocardiography is a painless test that uses sound waves to create images of your heart. It provides your doctor with information about the size and shape of your heart and how well your heart's chambers and valves are working. This procedure takes approximately one hour. There are no restrictions for this procedure. Please do NOT wear cologne, perfume, aftershave, or lotions (deodorant is allowed). Please arrive 15 minutes prior to your appointment time.    WHY IS MY DOCTOR PRESCRIBING ZIO? The Zio system is proven and trusted by physicians to detect and diagnose irregular heart rhythms -- and has been prescribed to hundreds of thousands of patients.  The FDA has cleared the Zio system to monitor for many different kinds of irregular heart rhythms. In a study, physicians were able to reach a diagnosis 90% of the time with the Zio system1.  You can wear the Zio monitor -- a small, discreet, comfortable patch -- during your normal day-to-day activity, including while you sleep, shower, and exercise, while it records every single heartbeat for analysis.  1Barrett, P., et al. Comparison of 24 Hour Holter Monitoring Versus 14 Day Novel Adhesive Patch Electrocardiographic Monitoring. American Journal of  Medicine, 2014.  ZIO VS. HOLTER MONITORING The Zio monitor can be comfortably worn for up to 14 days. Holter monitors can be worn for 24 to 48 hours, limiting the time to record any irregular heart rhythms you may have. Zio is able to capture data for the 51% of patients who have their first symptom-triggered arrhythmia after 48 hours.1  LIVE WITHOUT RESTRICTIONS The Zio ambulatory cardiac monitor is a small, unobtrusive, and water-resistant patch--you might even forget you're wearing it. The Zio monitor records and stores every beat of your heart, whether you're sleeping, working out, or showering.   We will order CT coronary calcium score. It will cost $99.00 and is not covered by insurance.  Please call to schedule.     [facility] [address] [place], [place] [address] [phone]      Follow-Up: At [facility], you and your health needs are our priority.  As part of our continuing mission to provide you with exceptional heart care, we have created designated Provider Care Teams.  These Care Teams include your primary Cardiologist (physician) and Advanced Practice Providers (APPs -  Physician Assistants and Nurse Practitioners) who all work together to provide you with the care you need, when you need it.  We recommend signing up for the patient portal called "MyChart".  Sign up information is provided on this After Visit Summary.  MyChart is used to connect with patients for Virtual Visits (Telemedicine).  Patients are able to view lab/test results, encounter notes, upcoming appointments, etc.  Non-urgent messages can be sent to your provider as well.   To  learn more about what you can do with MyChart, go to [email or web address].    Your next appointment:   3 month(s)  The format for your next appointment:   In Person  Provider:   Gypsy Balsam, MD    Other Instructions NA

## 2022-10-27 NOTE — Progress Notes (Unsigned)
Cardiology Consultation:    Date:  10/27/2022   ID:  Dakota Lopez, DOB 1975/11/03, MRN [id number]  PCP:  Ellis Parents, FNP  Cardiologist:  Gypsy Balsam, MD   Referring MD: Ellis Parents, FNP   Chief Complaint  Patient presents with   Heart Eval    History of Present Illness:    Dakota Lopez is a 47 y.o. male who is being seen today for the evaluation of family hx of CAD at the request of Ellis Parents, FNP. Multiple family members has premature CAD but they were smokers. He has seizures, first as child and after that in 2017, started Keppra, another seizures in March of this year, new med added. Asymptomatic, no CP, no SOB, work hard and no problem Does not smoke, never did, not on any diet, does not exercise.  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   Cholecalciferol (VITAMIN D3) 50 MCG (2000 UT) capsule Take 2,000 Units by mouth daily.   levETIRAcetam (KEPPRA) 1000 MG tablet Take 2,000 mg by mouth 2 (two) times daily.   Multiple Vitamin (MULTIVITAMIN) capsule Take 1 capsule by mouth daily.   OXcarbazepine (TRILEPTAL) 150 MG tablet Take 150 mg by mouth 2 (two) times daily.   testosterone cypionate (DEPOTESTOSTERONE CYPIONATE) 200 MG/ML injection Inject 200 mg into the muscle every 14 (fourteen) days.   vitamin B-12 (CYANOCOBALAMIN) 1000 MCG tablet Take 1,000 mcg by mouth daily.   Zinc  50 MG TABS Take 50 mg by mouth daily.   [DISCONTINUED] Magnesium 500 MG CAPS Take 500 mg by mouth daily.     Allergies:   Patient has no known allergies.   Social History   Socioeconomic History   Marital status: Married    Spouse name: Not on file   Number of children: 2   Years of education: Not on file   Highest education level: Not on file  Occupational History   Occupation: Truck Clinical research associate: [employer]  Tobacco Use   Smoking status: Former    Current packs/day: 0.00    Types: Cigarettes    Quit date: 07/17/2022    Years since quitting: 0.2   Smokeless tobacco: Current  Substance and Sexual Activity   Alcohol use: No    Alcohol/week: 0.0 standard drinks of alcohol   Drug use: No   Sexual activity: Not on file  Other Topics Concern   Not on file  Social History Narrative   ** Merged History Encounter **       Married - 2 daughters   Long-haul truck driver   2-3 caffeine drinks daily    Social Determinants of Health   Financial Resource Strain: Not on file  Food Insecurity: Not on file  Transportation Needs: Not on file  Physical  Activity: Not on file  Stress: Not on file  Social Connections: Not on file     Family History: The patient's family history includes Diabetes in his maternal aunt and maternal grandmother; Heart attack in his father and paternal grandfather; Hyperlipidemia in his father; Hypertension in his father and maternal grandfather; Kidney disease in his maternal grandmother; Lung cancer in his paternal aunt and paternal uncle; Stroke in his maternal grandfather. There is no history of Colon cancer. ROS:   Please see the history of present illness.    All 14 point review of systems negative except as described per history of present illness.  EKGs/Labs/Other Studies Reviewed:    The following studies were reviewed today:   EKG:     NSR, normal PR, normal QRS, no ST  Recent Labs: No results found for requested labs  within last 365 days.  Recent Lipid Panel No results found for: "CHOL", "TRIG", "HDL", "CHOLHDL", "VLDL", "LDLCALC", "LDLDIRECT"  Physical Exam:    VS:  BP 136/70 (BP Location: Left Arm, Patient Position: Sitting)   Pulse 61   Ht 6\' 2"  (1.88 m)   Wt 251 lb 6.4 oz (114 kg)   SpO2 97%   BMI 32.28 kg/m     Wt Readings from Last 3 Encounters:  10/27/22 251 lb 6.4 oz (114 kg)  09/14/22 259 lb (117.5 kg)  09/23/15 243 lb (110.2 kg)     GEN:  Well nourished, well developed in no acute distress HEENT: Normal NECK: No JVD; No carotid bruits LYMPHATICS: No lymphadenopathy CARDIAC: RRR, no murmurs, no rubs, no gallops RESPIRATORY:  Clear to auscultation without rales, wheezing or rhonchi  ABDOMEN: Soft, non-tender, non-distended MUSCULOSKELETAL:  No edema; No deformity  SKIN: Warm and dry NEUROLOGIC:  Alert and oriented x 3 PSYCHIATRIC:  Normal affect   ASSESSMENT:    1. Loss of consciousness (HCC)   2. Family history of early CAD   3. Seizure (HCC)   4. Obstructive sleep apnea   5. Dyslipidemia    PLAN:    In order of problems listed above:  Family hx of CAD  chol mildly elevated LDL 107 - no meds, will do CAC score Seizures, will put monitor to make sure there is no arrhythmias. From description looks like he has seizures but will do arrhythmia work up Echo will be schedule to check for structural heart Dz Dyslipidemia - CAC Sleep apnea - using mask   Medication Adjustments/Labs and Tests Ordered: Current medicines are reviewed at length with the patient today.  Concerns regarding medicines are outlined above.  Orders Placed This Encounter  Procedures   EKG 12-Lead   EKG 12-Lead   No orders of the defined types were placed in this encounter.   Signed, Georgeanna Lea, MD, [facility]. 10/27/2022 2:20 PM    [facility]

## 2022-11-22 ENCOUNTER — Ambulatory Visit: Payer: BC Managed Care – PPO

## 2022-11-23 ENCOUNTER — Telehealth: Payer: Self-pay

## 2022-11-23 NOTE — Telephone Encounter (Signed)
-----   Message from Gypsy Balsam sent at 11/22/2022  3:48 PM EDT ----- Normal monitor

## 2022-11-23 NOTE — Telephone Encounter (Signed)
Patient notified through my chart.

## 2022-11-27 ENCOUNTER — Other Ambulatory Visit (HOSPITAL_BASED_OUTPATIENT_CLINIC_OR_DEPARTMENT_OTHER): Payer: BC Managed Care – PPO

## 2022-12-04 DIAGNOSIS — R300 Dysuria: Secondary | ICD-10-CM | POA: Diagnosis not present

## 2022-12-04 DIAGNOSIS — M545 Low back pain, unspecified: Secondary | ICD-10-CM | POA: Diagnosis not present

## 2022-12-25 ENCOUNTER — Ambulatory Visit: Payer: BC Managed Care – PPO | Attending: Cardiology

## 2022-12-25 DIAGNOSIS — R0609 Other forms of dyspnea: Secondary | ICD-10-CM | POA: Diagnosis not present

## 2022-12-25 LAB — ECHOCARDIOGRAM COMPLETE: S' Lateral: 3.2 cm

## 2022-12-26 ENCOUNTER — Telehealth: Payer: Self-pay

## 2022-12-26 NOTE — Telephone Encounter (Signed)
Patient notified through my chart.

## 2022-12-26 NOTE — Telephone Encounter (Signed)
-----   Message from Gypsy Balsam sent at 12/26/2022  2:54 PM EDT ----- Echocardiogram showed preserved ejection fraction, overall looks good

## 2023-01-29 ENCOUNTER — Ambulatory Visit: Payer: BC Managed Care – PPO | Admitting: Cardiology

## 2023-02-19 ENCOUNTER — Ambulatory Visit: Payer: BC Managed Care – PPO | Admitting: Cardiology

## 2023-04-18 ENCOUNTER — Ambulatory Visit: Payer: BC Managed Care – PPO | Attending: Cardiology | Admitting: Cardiology
